# Patient Record
Sex: Male | Born: 1970 | Race: White | Hispanic: No | Marital: Married | Smoking: Current every day smoker
Health system: Southern US, Community
[De-identification: ages and names within clinical notes are randomized; demographics above are authoritative.]

## PROBLEM LIST (undated history)

## (undated) DIAGNOSIS — S149XXA Injury of unspecified nerves of neck, initial encounter: Secondary | ICD-10-CM

## (undated) DIAGNOSIS — F419 Anxiety disorder, unspecified: Secondary | ICD-10-CM

## (undated) DIAGNOSIS — F429 Obsessive-compulsive disorder, unspecified: Secondary | ICD-10-CM

## (undated) DIAGNOSIS — G589 Mononeuropathy, unspecified: Secondary | ICD-10-CM

## (undated) DIAGNOSIS — E119 Type 2 diabetes mellitus without complications: Secondary | ICD-10-CM

## (undated) DIAGNOSIS — F410 Panic disorder [episodic paroxysmal anxiety] without agoraphobia: Secondary | ICD-10-CM

## (undated) HISTORY — PX: HERNIA REPAIR: SHX51

## (undated) HISTORY — PX: CHOLECYSTECTOMY: SHX55

---

## 2013-09-22 ENCOUNTER — Encounter: Payer: Self-pay | Admitting: Emergency Medicine

## 2013-09-22 ENCOUNTER — Emergency Department
Admission: EM | Admit: 2013-09-22 | Discharge: 2013-09-22 | Disposition: A | Discharge status: Home / Self Care | Payer: BC Managed Care – PPO | Source: Home / Self Care | Attending: Family Medicine | Admitting: Family Medicine

## 2013-09-22 DIAGNOSIS — J069 Acute upper respiratory infection, unspecified: Secondary | ICD-10-CM

## 2013-09-22 DIAGNOSIS — R0781 Pleurodynia: Secondary | ICD-10-CM

## 2013-09-22 DIAGNOSIS — R062 Wheezing: Secondary | ICD-10-CM

## 2013-09-22 DIAGNOSIS — J40 Bronchitis, not specified as acute or chronic: Secondary | ICD-10-CM

## 2013-09-22 HISTORY — DX: Obsessive-compulsive disorder, unspecified: F42.9

## 2013-09-22 HISTORY — DX: Injury of unspecified nerves of neck, initial encounter: S14.9XXA

## 2013-09-22 HISTORY — DX: Mononeuropathy, unspecified: G58.9

## 2013-09-22 MED ORDER — AZITHROMYCIN 250 MG PO TABS: ORAL_TABLET | ORAL | Status: DC

## 2013-09-22 MED ORDER — METHYLPREDNISOLONE SODIUM SUCC 125 MG IJ SOLR
125.0000 mg | Freq: Once | INTRAMUSCULAR | Status: AC
  Administered 2013-09-22: 125 mg via INTRAMUSCULAR

## 2013-09-22 MED ORDER — GUAIFENESIN-CODEINE 100-10 MG/5ML PO SOLN: 5.0000 mL | Freq: Three times a day (TID) | ORAL | Status: DC | PRN

## 2013-09-22 MED ORDER — MELOXICAM 15 MG PO TABS: 15.0000 mg | ORAL_TABLET | Freq: Every day | ORAL | Status: DC

## 2013-09-22 NOTE — ED Provider Notes (Signed)
CSN: [id number]     Arrival date & time 09/22/13  1115 History   First MD Initiated Contact with Patient 09/22/13 1128     Chief Complaint  Patient presents with  . Nasal Congestion  . Back Pain    HPI  URI Symptoms  Onset: 2-3 weeks  Description: cough, pleuritic cp, rhinorrhea, nasal congestion Modifying factors:  Has pain with deep breathing. Worse in am. Worse with cough. Has been using vicodin with minimal improvement in sxs   Symptoms Nasal discharge: yes Fever: no Sore throat: no Cough: yes Wheezing: yes Ear pain: no GI symptoms: no Sick contacts: no  Red Flags  Stiff neck: no Dyspnea: no Rash: no Swallowing difficulty: no  Sinusitis Risk Factors Headache/face pain: no Double sickening: n tooth pain: ono  Allergy Risk Factors Sneezing: no Itchy scratchy throat: no Seasonal symptoms: no  Flu Risk Factors Headache: no muscle aches: no severe fatigue: no   Past Medical History  Diagnosis Date  . OCD (obsessive compulsive disorder)   . Pinched nerve in neck    Past Surgical History  Procedure Laterality Date  . Cholecystectomy    . Hernia repair     Family History  Problem Relation Age of Onset  . Cancer Mother    History  Substance Use Topics  . Smoking status: Former Games developer  . Smokeless tobacco: Never Used  . Alcohol Use: No    Review of Systems  All other systems reviewed and are negative.    Allergies  Penicillins  Home Medications   Current Outpatient Rx  Name  Route  Sig  Dispense  Refill  . clonazePAM (KLONOPIN) 1 MG tablet   Oral   Take 1 mg by mouth 2 (two) times daily as needed for anxiety.         Marland Kitchen HYDROcodone-acetaminophen (NORCO) 10-325 MG per tablet   Oral   Take 1 tablet by mouth every 6 (six) hours as needed for pain.         Marland Kitchen omeprazole (PRILOSEC) 20 MG capsule   Oral   Take 20 mg by mouth daily.         Marland Kitchen azithromycin (ZITHROMAX) 250 MG tablet      Take 2 tabs PO x 1 dose, then 1 tab PO QD x 4  days   6 tablet   0   . meloxicam (MOBIC) 15 MG tablet   Oral   Take 1 tablet (15 mg total) by mouth daily.   30 tablet   1    BP 122/76  Pulse 84  Temp(Src) 98.3 F (36.8 C) (Oral)  Resp 16  Ht 6\' 3"  (1.905 m)  Wt 221 lb 4 oz (100.358 kg)  BMI 27.65 kg/m2  SpO2 100% Physical Exam  Constitutional: He appears well-developed and well-nourished.  HENT:  Head: Normocephalic and atraumatic.  Right Ear: External ear normal.  Left Ear: External ear normal.  +nasal erythema, rhinorrhea bilaterally, + post oropharyngeal erythema    Eyes: Conjunctivae are normal. Pupils are equal, round, and reactive to light.  Neck: Normal range of motion. Neck supple.  Cardiovascular: Normal rate, regular rhythm and normal heart sounds.   Pulmonary/Chest: Effort normal and breath sounds normal. He has no wheezes.  + anterior and posterior chest wall TTP  + pain with deep breathing  Abdominal: Soft.  Musculoskeletal: Normal range of motion.  Lymphadenopathy:    He has no cervical adenopathy.  Neurological: He is alert.  Skin: Skin is warm.  ED Course  Procedures (including critical care time) Labs Review Labs Reviewed - No data to display Imaging Review No results found.    MDM   1. URI (upper respiratory infection)   2. Bronchitis   3. Pleuritic chest pain   4. Wheezing    Likely viral sxs with overlapping post viral cough and pleuritic.  Solumedrol 125mg  IM x 1 for wheezing. Zpak for atypical coverage.  Mobic and robitussin ac.  Discussed infectious and resp red flags  Follow up as needed.     The patient and/or caregiver has been counseled thoroughly with regard to treatment plan and/or medications prescribed including dosage, schedule, interactions, rationale for use, and possible side effects and they verbalize understanding. Diagnoses and expected course of recovery discussed and will return if not improved as expected or if the condition worsens. Patient and/or  caregiver verbalized understanding.        Doree Albee, MD 09/22/13 1153

## 2013-09-22 NOTE — ED Notes (Signed)
Patient c/o head cold, congestion, and mid upper back pain x 3 wks. Patient has tried OTC Sudafed with relief.

## 2015-07-27 DIAGNOSIS — R945 Abnormal results of liver function studies: Secondary | ICD-10-CM | POA: Insufficient documentation

## 2015-07-27 DIAGNOSIS — R7989 Other specified abnormal findings of blood chemistry: Secondary | ICD-10-CM | POA: Insufficient documentation

## 2015-07-27 DIAGNOSIS — E119 Type 2 diabetes mellitus without complications: Secondary | ICD-10-CM | POA: Insufficient documentation

## 2015-07-28 DIAGNOSIS — Z720 Tobacco use: Secondary | ICD-10-CM | POA: Insufficient documentation

## 2016-04-01 DIAGNOSIS — G894 Chronic pain syndrome: Secondary | ICD-10-CM | POA: Insufficient documentation

## 2016-04-01 DIAGNOSIS — M542 Cervicalgia: Secondary | ICD-10-CM | POA: Insufficient documentation

## 2018-04-02 DIAGNOSIS — Z79899 Other long term (current) drug therapy: Secondary | ICD-10-CM | POA: Insufficient documentation

## 2018-04-02 DIAGNOSIS — R05 Cough: Secondary | ICD-10-CM | POA: Insufficient documentation

## 2018-04-02 DIAGNOSIS — F17200 Nicotine dependence, unspecified, uncomplicated: Secondary | ICD-10-CM | POA: Insufficient documentation

## 2018-04-02 DIAGNOSIS — Z7289 Other problems related to lifestyle: Secondary | ICD-10-CM | POA: Insufficient documentation

## 2018-04-02 DIAGNOSIS — F410 Panic disorder [episodic paroxysmal anxiety] without agoraphobia: Secondary | ICD-10-CM | POA: Insufficient documentation

## 2018-04-02 DIAGNOSIS — R0981 Nasal congestion: Secondary | ICD-10-CM | POA: Insufficient documentation

## 2018-04-02 DIAGNOSIS — F1210 Cannabis abuse, uncomplicated: Secondary | ICD-10-CM | POA: Insufficient documentation

## 2018-04-03 ENCOUNTER — Other Ambulatory Visit: Payer: Self-pay

## 2018-04-03 ENCOUNTER — Emergency Department (HOSPITAL_BASED_OUTPATIENT_CLINIC_OR_DEPARTMENT_OTHER)
Admission: EM | Admit: 2018-04-03 | Discharge: 2018-04-03 | Disposition: A | Discharge status: Home / Self Care | Payer: Self-pay | Attending: Emergency Medicine | Admitting: Emergency Medicine

## 2018-04-03 ENCOUNTER — Encounter (HOSPITAL_BASED_OUTPATIENT_CLINIC_OR_DEPARTMENT_OTHER): Payer: Self-pay

## 2018-04-03 DIAGNOSIS — Z765 Malingerer [conscious simulation]: Secondary | ICD-10-CM

## 2018-04-03 DIAGNOSIS — F410 Panic disorder [episodic paroxysmal anxiety] without agoraphobia: Secondary | ICD-10-CM

## 2018-04-03 HISTORY — DX: Panic disorder (episodic paroxysmal anxiety): F41.0

## 2018-04-03 HISTORY — DX: Anxiety disorder, unspecified: F41.9

## 2018-04-03 MED ORDER — HYDROXYZINE HCL 10 MG/5ML PO SYRP
ORAL_SOLUTION | ORAL | Status: AC
  Filled 2018-04-03: qty 1

## 2018-04-03 MED ORDER — HYDROXYZINE HCL 10 MG/5ML PO SYRP: 10.0000 mg | ORAL_SOLUTION | Freq: Once | ORAL | Status: DC

## 2018-04-03 MED ORDER — HYDROXYZINE HCL 10 MG PO TABS
10.0000 mg | ORAL_TABLET | Freq: Once | ORAL | Status: DC
  Filled 2018-04-03: qty 1

## 2018-04-03 NOTE — ED Provider Notes (Addendum)
[facility] EMERGENCY DEPARTMENT Provider Note   CSN: [id number] Arrival date & time: 04/02/18  2359     History   Chief Complaint Chief Complaint  Patient presents with  . Panic Attack    HPI Wayne Jackson. is a 47 y.o. male.  The history is provided by the patient. No language interpreter was used.  Medication Refill  Medications/supplies requested:  Klonipin Reason for request:  Medications ran out Medications taken before: yes - see home medications   Patient has complete original prescription information: no   Source of information: DEA database  Anxiety  This is a chronic problem. The current episode started more than 1 week ago. The problem occurs constantly. The problem has not changed since onset.Pertinent negatives include no chest pain, no abdominal pain, no headaches and no shortness of breath. Nothing aggravates the symptoms. Nothing relieves the symptoms. The treatment provided no relief.  Patient with a h/o anxiety who takes Klonopin 3 times daily presents stating he is visiting from [place] and out of his Klonopin.  States also he as a cough that is dry and nasal congestion.  Stated he was seen yesterday at an outside facility and prescribed cough syrup.  States he was having a panic attack but this has resolved prior to arrival.  Last took cough syrup at 830 pm. He would like a Klonopin RX as he did not receive one at urgent care.    Past Medical History:  Diagnosis Date  . Anxiety   . OCD (obsessive compulsive disorder)   . Panic attack   . Pinched nerve in neck     There are no active problems to display for this patient.   Past Surgical History:  Procedure Laterality Date  . CHOLECYSTECTOMY    . HERNIA REPAIR          Home Medications    Prior to Admission medications   Medication Sig Start Date End Date Taking? Authorizing Provider  azithromycin (ZITHROMAX) 250 MG tablet Take 2 tabs PO x 1 dose, then 1 tab PO QD x 4 days 09/22/13    Floydene Flock, MD  clonazePAM (KLONOPIN) 1 MG tablet Take 1 mg by mouth 2 (two) times daily as needed for anxiety.    [provider]  guaiFENesin-codeine 100-10 MG/5ML syrup Take 5 mLs by mouth 3 (three) times daily as needed for cough. 09/22/13   Floydene Flock, MD  HYDROcodone-acetaminophen (NORCO) 10-325 MG per tablet Take 1 tablet by mouth every 6 (six) hours as needed for pain.    [provider]  meloxicam (MOBIC) 15 MG tablet Take 1 tablet (15 mg total) by mouth daily. 09/22/13   Floydene Flock, MD  omeprazole (PRILOSEC) 20 MG capsule Take 20 mg by mouth daily.    [provider]    Family History Family History  Problem Relation Age of Onset  . Cancer Mother     Social History Social History   Tobacco Use  . Smoking status: Current Every Day Smoker  . Smokeless tobacco: Never Used  Substance Use Topics  . Alcohol use: Yes    Comment: occ  . Drug use: Yes    Types: Marijuana     Allergies   Penicillins   Review of Systems Review of Systems  Constitutional: Negative for diaphoresis.  Respiratory: Negative for shortness of breath.   Cardiovascular: Negative for chest pain, palpitations and leg swelling.  Gastrointestinal: Negative for abdominal pain, nausea and vomiting.  Neurological:  Negative for dizziness, tremors, syncope, facial asymmetry, speech difficulty, weakness, light-headedness, numbness and headaches.  Psychiatric/Behavioral: Negative for hallucinations, self-injury and suicidal ideas. The patient is not nervous/anxious.   All other systems reviewed and are negative.    Physical Exam Updated Vital Signs BP (!) 131/98 (BP Location: Left Arm)   Pulse 77   Temp 98.3 F (36.8 C) (Oral)   Resp 20   Ht  (1.905 m)   Wt 86.5 kg (190 lb 11.2 oz)   SpO2 100%   BMI 23.84 kg/m   Physical Exam  Constitutional: He is oriented to person, place, and time. He appears well-developed and well-nourished. No distress.    Resting comfortably in chair smiling using his phone  HENT:  Head: Normocephalic and atraumatic.  Right Ear: External ear normal.  Left Ear: External ear normal.  Mouth/Throat: Oropharynx is clear and moist. No oropharyngeal exudate.  Eyes: Pupils are equal, round, and reactive to light. Conjunctivae are normal.  Neck: Normal range of motion. Neck supple.  Cardiovascular: Normal rate, regular rhythm, normal heart sounds and intact distal pulses.  Pulmonary/Chest: Effort normal and breath sounds normal. No stridor. He has no wheezes. He has no rales.  Abdominal: Soft. Bowel sounds are normal. He exhibits no mass. There is no tenderness. There is no rebound and no guarding.  Musculoskeletal: Normal range of motion. He exhibits no edema or tenderness.  Neurological: He is alert and oriented to person, place, and time.  Skin: Skin is warm and dry. Capillary refill takes less than 2 seconds.  Psychiatric: His mood appears not anxious. His affect is not labile. He expresses no homicidal and no suicidal ideation.  Nursing note and vitals reviewed.    ED Treatments / Results  Labs (all labs ordered are listed, but only abnormal results are displayed) Labs Reviewed - No data to display  EKG None  Radiology No results found.  Procedures Procedures (including critical care time)  Medications Ordered in ED Medications  hydrOXYzine (ATARAX) 10 MG/5ML syrup 10 mg (10 mg Oral Refused 04/03/18 0104)    DEA database reviewed and patient is getting RX for Klonopin every month from a doctor in [place].   Patient was informed we do not refill narcotic or benzodiazepine scripts in the ED.  EDP referred patient to the signs posted at registration. Patient drove self to the ED.  EDP stated we could not give this medication in the ED and allow patient to drive or uber to the place he is staying.    Patient originally stated he did not receive any RX from urgent care.  He then stated he received  cough syrup at urgent care.  He then revised his statement and stated he filled the cough syrup which he was told would help with his anxiety for not the other medications prescribed to him. He stated he feels much better since he arrived in the ED.  EDP stated she would review what he had been given from urgent care and give him a dose of non narcotic and no benzodiazepine medication in the ED.  Patient was happy with this.  EDP reviewed outside records.  Patient was given RX for promethazine cough syrup as well as guaifenesin and atarax for his anxiety (quantity 30).  EDP wrote for a dose of atarax.  Nurse informed EDP patient refused as this was not the medication he wanted.  Patient was instructed to follow up with his PMD for refill of his klonopin.  The patient was appropriately medically screened and life threatening and emergent conditions considered.  Patient has no signs of withdrawal and has already stated he is not currently having an attack.  His exam and vitals signs are benign and reassuring.   Patient reported to me would not take his papers because he "did not get what he wanted." patient is drug seeking and he is not getting a refill of benzodiazepines in the ED  Final Clinical Impressions(s) / ED Diagnoses   Final diagnoses:  Panic attack   Follow up with your family doctor for refills on your klonopin.  Return for weakness, numbness, changes in vision or speech, fevers >100.4 unrelieved by medication, shortness of breath, intractable vomiting, or diarrhea, abdominal pain, Inability to tolerate liquids or food, cough, altered mental status or any concerns. No signs of systemic illness or infection. The patient is nontoxic-appearing on exam and vital signs are within normal limits.   I have reviewed the triage vital signs and the nursing notes. Pertinent labs &imaging results that were available during my care of the patient were reviewed by me and considered in my medical  decision making (see chart for details).  After history, exam, and medical workup I feel the patient has been appropriately medically screened and is safe for discharge home. Pertinent diagnoses were discussed with the patient. Patient was given return precautions.      Arlisa Leclere, MD 04/03/18 1610    Cy Blamer, MD 04/03/18 2304

## 2018-04-03 NOTE — ED Notes (Signed)
Pt refused vistaril, wants a dose of klonopin.  Informed pt that he is not getting a dose of klonopin and that the vistaril works the same, pt refused.

## 2018-04-03 NOTE — ED Triage Notes (Signed)
C/o panic attacks-states he is out of klonopin-pt states he lives in [place] and is working in town-NAD-steady gait

## 2018-04-03 NOTE — ED Notes (Signed)
Pt refused the medication that was offered to him.  Pt was texting on phone throughout discharge, at this time he does not need anything further.

## 2018-07-17 ENCOUNTER — Other Ambulatory Visit (HOSPITAL_COMMUNITY): Payer: Self-pay | Admitting: Family Medicine

## 2018-07-17 DIAGNOSIS — R634 Abnormal weight loss: Secondary | ICD-10-CM

## 2018-07-25 ENCOUNTER — Emergency Department (HOSPITAL_BASED_OUTPATIENT_CLINIC_OR_DEPARTMENT_OTHER): Payer: BLUE CROSS/BLUE SHIELD

## 2018-07-25 ENCOUNTER — Emergency Department (HOSPITAL_BASED_OUTPATIENT_CLINIC_OR_DEPARTMENT_OTHER)
Admission: EM | Admit: 2018-07-25 | Discharge: 2018-07-25 | Disposition: A | Discharge status: Home / Self Care | Payer: BLUE CROSS/BLUE SHIELD | Attending: Emergency Medicine | Admitting: Emergency Medicine

## 2018-07-25 ENCOUNTER — Encounter (HOSPITAL_BASED_OUTPATIENT_CLINIC_OR_DEPARTMENT_OTHER): Payer: Self-pay

## 2018-07-25 ENCOUNTER — Other Ambulatory Visit: Payer: Self-pay

## 2018-07-25 DIAGNOSIS — Z6379 Other stressful life events affecting family and household: Secondary | ICD-10-CM | POA: Diagnosis not present

## 2018-07-25 DIAGNOSIS — R197 Diarrhea, unspecified: Secondary | ICD-10-CM | POA: Insufficient documentation

## 2018-07-25 DIAGNOSIS — R630 Anorexia: Secondary | ICD-10-CM | POA: Diagnosis not present

## 2018-07-25 DIAGNOSIS — Z794 Long term (current) use of insulin: Secondary | ICD-10-CM | POA: Diagnosis not present

## 2018-07-25 DIAGNOSIS — Z79899 Other long term (current) drug therapy: Secondary | ICD-10-CM | POA: Diagnosis not present

## 2018-07-25 DIAGNOSIS — R0602 Shortness of breath: Secondary | ICD-10-CM | POA: Insufficient documentation

## 2018-07-25 DIAGNOSIS — R634 Abnormal weight loss: Secondary | ICD-10-CM | POA: Diagnosis not present

## 2018-07-25 DIAGNOSIS — R739 Hyperglycemia, unspecified: Secondary | ICD-10-CM

## 2018-07-25 DIAGNOSIS — F17210 Nicotine dependence, cigarettes, uncomplicated: Secondary | ICD-10-CM | POA: Diagnosis not present

## 2018-07-25 DIAGNOSIS — R51 Headache: Secondary | ICD-10-CM | POA: Insufficient documentation

## 2018-07-25 DIAGNOSIS — R112 Nausea with vomiting, unspecified: Secondary | ICD-10-CM | POA: Diagnosis present

## 2018-07-25 DIAGNOSIS — E1165 Type 2 diabetes mellitus with hyperglycemia: Secondary | ICD-10-CM | POA: Diagnosis not present

## 2018-07-25 HISTORY — DX: Type 2 diabetes mellitus without complications: E11.9

## 2018-07-25 LAB — URINALYSIS, ROUTINE W REFLEX MICROSCOPIC
Bilirubin Urine: NEGATIVE
Glucose, UA: >=500 mg/dL — AB
Hgb urine dipstick: NEGATIVE
Ketones, ur: 40 mg/dL — AB
Leukocytes, UA: NEGATIVE
Nitrite: NEGATIVE
Protein, ur: NEGATIVE mg/dL
Specific Gravity, Urine: <1.005 — ABNORMAL LOW (ref 1.005–1.030)
pH: 5.5 (ref 5.0–8.0)

## 2018-07-25 LAB — CBC WITH DIFFERENTIAL/PLATELET
Basophils Absolute: 0 10*3/uL (ref 0.0–0.1)
Basophils Relative: 0 %
Eosinophils Absolute: 0.1 10*3/uL (ref 0.0–0.7)
Eosinophils Relative: 1 %
HCT: 40.6 % (ref 39.0–52.0)
Hemoglobin: 15.1 g/dL (ref 13.0–17.0)
Lymphocytes Relative: 20 %
Lymphs Abs: 2.7 10*3/uL (ref 0.7–4.0)
MCH: 31.9 pg (ref 26.0–34.0)
MCHC: 37.2 g/dL — ABNORMAL HIGH (ref 30.0–36.0)
MCV: 85.7 fL (ref 78.0–100.0)
Monocytes Absolute: 1.1 10*3/uL — ABNORMAL HIGH (ref 0.1–1.0)
Monocytes Relative: 8 %
Neutro Abs: 9.8 10*3/uL — ABNORMAL HIGH (ref 1.7–7.7)
Neutrophils Relative %: 71 %
Platelets: 206 10*3/uL (ref 150–400)
RBC: 4.74 MIL/uL (ref 4.22–5.81)
RDW: 12.5 % (ref 11.5–15.5)
WBC: 13.7 10*3/uL — ABNORMAL HIGH (ref 4.0–10.5)

## 2018-07-25 LAB — COMPREHENSIVE METABOLIC PANEL
ALT: 99 U/L — ABNORMAL HIGH (ref 0–44)
AST: 43 U/L — ABNORMAL HIGH (ref 15–41)
Albumin: 4.3 g/dL (ref 3.5–5.0)
Alkaline Phosphatase: 55 U/L (ref 38–126)
Anion gap: 15 (ref 5–15)
BUN: 13 mg/dL (ref 6–20)
CO2: 24 mmol/L (ref 22–32)
Calcium: 8.5 mg/dL — ABNORMAL LOW (ref 8.9–10.3)
Chloride: 85 mmol/L — ABNORMAL LOW (ref 98–111)
Creatinine, Ser: 0.71 mg/dL (ref 0.61–1.24)
GFR calc Af Amer: >60 mL/min (ref >60)
GFR calc non Af Amer: >60 mL/min (ref >60)
Glucose, Bld: 561 mg/dL (ref 70–99)
Potassium: 4.3 mmol/L (ref 3.5–5.1)
Sodium: 124 mmol/L — ABNORMAL LOW (ref 135–145)
Total Bilirubin: 1.7 mg/dL — ABNORMAL HIGH (ref 0.3–1.2)
Total Protein: 8 g/dL (ref 6.5–8.1)

## 2018-07-25 LAB — TROPONIN I: Troponin I: <0.03 ng/mL (ref <0.03)

## 2018-07-25 LAB — URINALYSIS, MICROSCOPIC (REFLEX)

## 2018-07-25 LAB — LIPASE, BLOOD: Lipase: 38 U/L (ref 11–51)

## 2018-07-25 LAB — CBG MONITORING, ED
Glucose-Capillary: >600 mg/dL (ref 70–99)
Glucose-Capillary: 347 mg/dL — ABNORMAL HIGH (ref 70–99)

## 2018-07-25 MED ORDER — METOCLOPRAMIDE HCL 10 MG PO TABS: 10.0000 mg | ORAL_TABLET | Freq: Four times a day (QID) | ORAL | 0 refills | Status: DC

## 2018-07-25 MED ORDER — METOCLOPRAMIDE HCL 5 MG/ML IJ SOLN
10.0000 mg | Freq: Once | INTRAMUSCULAR | Status: AC
  Administered 2018-07-25: 10 mg via INTRAVENOUS
  Filled 2018-07-25: qty 2

## 2018-07-25 MED ORDER — OXYCODONE-ACETAMINOPHEN 5-325 MG PO TABS: 1.0000 | ORAL_TABLET | Freq: Four times a day (QID) | ORAL | 0 refills | Status: DC | PRN

## 2018-07-25 MED ORDER — KETOROLAC TROMETHAMINE 30 MG/ML IJ SOLN
30.0000 mg | Freq: Once | INTRAMUSCULAR | Status: AC
  Administered 2018-07-25: 30 mg via INTRAVENOUS
  Filled 2018-07-25: qty 1

## 2018-07-25 MED ORDER — INSULIN GLARGINE 100 UNIT/ML ~~LOC~~ SOLN
10.0000 [IU] | Freq: Once | SUBCUTANEOUS | Status: AC
  Administered 2018-07-25: 10 [IU] via SUBCUTANEOUS
  Filled 2018-07-25: qty 10

## 2018-07-25 MED ORDER — INSULIN LISPRO 100 UNIT/ML CARTRIDGE: SUBCUTANEOUS | 0 refills | Status: AC

## 2018-07-25 MED ORDER — SODIUM CHLORIDE 0.9 % IV BOLUS
1000.0000 mL | Freq: Once | INTRAVENOUS | Status: AC
  Administered 2018-07-25: 1000 mL via INTRAVENOUS

## 2018-07-25 MED ORDER — INSULIN REGULAR HUMAN 100 UNIT/ML IJ SOLN: 9.0000 [IU] | Freq: Once | INTRAMUSCULAR | Status: DC

## 2018-07-25 MED ORDER — BLOOD GLUCOSE MONITOR KIT: PACK | 0 refills | Status: AC

## 2018-07-25 MED ORDER — INSULIN GLARGINE 300 UNIT/ML ~~LOC~~ SOPN: 10.0000 [IU] | PEN_INJECTOR | Freq: Every day | SUBCUTANEOUS | 0 refills | Status: DC

## 2018-07-25 NOTE — ED Notes (Signed)
Date and time results received: 07/25/18 1845   Test: glucose Critical Value: 561 Name of Provider Notified: Trinna PostAlex PA Orders Received? Or Actions Taken?: no orders given
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
## 2018-07-25 NOTE — ED Notes (Signed)
Pt teaching provided on medications that may cause drowsiness. Pt instructed not to drive or operate heavy machinery while taking the prescribed medication. Pt verbalized understanding.   

## 2018-07-25 NOTE — Discharge Instructions (Addendum)
Take Toujeo as prescribed daily.  Use Humalog sliding scale insulin.  Please call your doctor tomorrow to make them aware of your visit today.  Take Reglan every 6 hours as needed for nausea or vomiting.  Take 1-2 Percocet every 6 hours as needed for severe pain.  Continue ibuprofen as you are taking at home.  Please return to the emergency department if you develop any new or worsening symptoms.  Do not drink alcohol, drive, operate machinery or participate in any other potentially dangerous activities while taking opiate pain medication as it may make you sleepy. Do not take this medication with any other sedating medications, either prescription or over-the-counter. If you were prescribed Percocet or Vicodin, do not take these with acetaminophen (Tylenol) as it is already contained within these medications and overdose of Tylenol is dangerous.   This medication is an opiate (or narcotic) pain medication and can be habit forming.  Use it as little as possible to achieve adequate pain control.  Do not use or use it with extreme caution if you have a history of opiate abuse or dependence. This medication is intended for your use only - do not give any to anyone else and keep it in a secure place where nobody else, especially children, have access to it. It will also cause or worsen constipation, so you may want to consider taking an over-the-counter stool softener while you are taking this medication.

## 2018-07-29 ENCOUNTER — Telehealth (HOSPITAL_BASED_OUTPATIENT_CLINIC_OR_DEPARTMENT_OTHER): Payer: Self-pay | Admitting: Emergency Medicine

## 2018-10-22 DIAGNOSIS — F419 Anxiety disorder, unspecified: Secondary | ICD-10-CM | POA: Insufficient documentation

## 2018-10-22 DIAGNOSIS — F321 Major depressive disorder, single episode, moderate: Secondary | ICD-10-CM | POA: Insufficient documentation

## 2018-10-22 DIAGNOSIS — G479 Sleep disorder, unspecified: Secondary | ICD-10-CM | POA: Insufficient documentation

## 2020-03-03 LAB — HM DIABETES EYE EXAM
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
## 2020-09-29 DIAGNOSIS — G479 Sleep disorder, unspecified: Secondary | ICD-10-CM | POA: Diagnosis not present

## 2020-09-29 DIAGNOSIS — F419 Anxiety disorder, unspecified: Secondary | ICD-10-CM | POA: Diagnosis not present

## 2020-09-29 DIAGNOSIS — F321 Major depressive disorder, single episode, moderate: Secondary | ICD-10-CM | POA: Diagnosis not present

## 2020-09-29 DIAGNOSIS — Z79899 Other long term (current) drug therapy: Secondary | ICD-10-CM | POA: Diagnosis not present

## 2020-10-01 DIAGNOSIS — E782 Mixed hyperlipidemia: Secondary | ICD-10-CM | POA: Diagnosis not present

## 2020-10-01 DIAGNOSIS — E1165 Type 2 diabetes mellitus with hyperglycemia: Secondary | ICD-10-CM | POA: Diagnosis not present

## 2020-10-01 DIAGNOSIS — E1142 Type 2 diabetes mellitus with diabetic polyneuropathy: Secondary | ICD-10-CM | POA: Diagnosis not present

## 2020-10-01 DIAGNOSIS — Z720 Tobacco use: Secondary | ICD-10-CM | POA: Diagnosis not present

## 2020-10-06 ENCOUNTER — Encounter: Payer: Self-pay | Admitting: Physician Assistant

## 2020-10-06 ENCOUNTER — Ambulatory Visit: Payer: BC Managed Care – PPO | Admitting: Physician Assistant

## 2020-10-06 ENCOUNTER — Other Ambulatory Visit: Payer: Self-pay

## 2020-10-06 VITALS — BP 117/88 | HR 88 | Temp 98.0°F | Ht 74.0 in | Wt 207.8 lb

## 2020-10-06 DIAGNOSIS — Z1159 Encounter for screening for other viral diseases: Secondary | ICD-10-CM

## 2020-10-06 DIAGNOSIS — K732 Chronic active hepatitis, not elsewhere classified: Secondary | ICD-10-CM

## 2020-10-06 DIAGNOSIS — E1159 Type 2 diabetes mellitus with other circulatory complications: Secondary | ICD-10-CM

## 2020-10-06 DIAGNOSIS — E119 Type 2 diabetes mellitus without complications: Secondary | ICD-10-CM

## 2020-10-06 DIAGNOSIS — E1169 Type 2 diabetes mellitus with other specified complication: Secondary | ICD-10-CM

## 2020-10-06 DIAGNOSIS — B1920 Unspecified viral hepatitis C without hepatic coma: Secondary | ICD-10-CM

## 2020-10-06 DIAGNOSIS — F909 Attention-deficit hyperactivity disorder, unspecified type: Secondary | ICD-10-CM | POA: Diagnosis not present

## 2020-10-06 DIAGNOSIS — Z114 Encounter for screening for human immunodeficiency virus [HIV]: Secondary | ICD-10-CM

## 2020-10-06 DIAGNOSIS — Z794 Long term (current) use of insulin: Secondary | ICD-10-CM | POA: Diagnosis not present

## 2020-10-06 DIAGNOSIS — E785 Hyperlipidemia, unspecified: Secondary | ICD-10-CM

## 2020-10-06 DIAGNOSIS — G4700 Insomnia, unspecified: Secondary | ICD-10-CM

## 2020-10-06 DIAGNOSIS — Z23 Encounter for immunization: Secondary | ICD-10-CM

## 2020-10-06 DIAGNOSIS — I152 Hypertension secondary to endocrine disorders: Secondary | ICD-10-CM

## 2020-10-06 NOTE — Patient Instructions (Signed)
Diabetes Mellitus and Exercise Exercising regularly is important for your overall health, especially when you have diabetes (diabetes mellitus). Exercising is not only about losing weight. It has many other health benefits, such as increasing muscle strength and bone density and reducing body fat and stress. This leads to improved fitness, flexibility, and endurance, all of which result in better overall health. Exercise has additional benefits for people with diabetes, including:  Reducing appetite.  Helping to lower and control blood glucose.  Lowering blood pressure.  Helping to control amounts of fatty substances (lipids) in the blood, such as cholesterol and triglycerides.  Helping the body to respond better to insulin (improving insulin sensitivity).  Reducing how much insulin the body needs.  Decreasing the risk for heart disease by: ? Lowering cholesterol and triglyceride levels. ? Increasing the levels of good cholesterol. ? Lowering blood glucose levels. What is my activity plan? Your health care provider or certified diabetes educator can help you make a plan for the type and frequency of exercise (activity plan) that works for you. Make sure that you:  Do at least 150 minutes of moderate-intensity or vigorous-intensity exercise each week. This could be brisk walking, biking, or water aerobics. ? Do stretching and strength exercises, such as yoga or weightlifting, at least 2 times a week. ? Spread out your activity over at least 3 days of the week.  Get some form of physical activity every day. ? Do not go more than 2 days in a row without some kind of physical activity. ? Avoid being inactive for more than 30 minutes at a time. Take frequent breaks to walk or stretch.  Choose a type of exercise or activity that you enjoy, and set realistic goals.  Start slowly, and gradually increase the intensity of your exercise over time. What do I need to know about managing my  diabetes?   Check your blood glucose before and after exercising. ? If your blood glucose is 240 mg/dL (13.3 mmol/L) or higher before you exercise, check your urine for ketones. If you have ketones in your urine, do not exercise until your blood glucose returns to normal. ? If your blood glucose is 100 mg/dL (5.6 mmol/L) or lower, eat a snack containing 15-20 grams of carbohydrate. Check your blood glucose 15 minutes after the snack to make sure that your level is above 100 mg/dL (5.6 mmol/L) before you start your exercise.  Know the symptoms of low blood glucose (hypoglycemia) and how to treat it. Your risk for hypoglycemia increases during and after exercise. Common symptoms of hypoglycemia can include: ? Hunger. ? Anxiety. ? Sweating and feeling clammy. ? Confusion. ? Dizziness or feeling light-headed. ? Increased heart rate or palpitations. ? Blurry vision. ? Tingling or numbness around the mouth, lips, or tongue. ? Tremors or shakes. ? Irritability.  Keep a rapid-acting carbohydrate snack available before, during, and after exercise to help prevent or treat hypoglycemia.  Avoid injecting insulin into areas of the body that are going to be exercised. For example, avoid injecting insulin into: ? The arms, when playing tennis. ? The legs, when jogging.  Keep records of your exercise habits. Doing this can help you and your health care provider adjust your diabetes management plan as needed. Write down: ? Food that you eat before and after you exercise. ? Blood glucose levels before and after you exercise. ? The type and amount of exercise you have done. ? When your insulin is expected to peak, if you use   insulin. Avoid exercising at times when your insulin is peaking.  When you start a new exercise or activity, work with your health care provider to make sure the activity is safe for you, and to adjust your insulin, medicines, or food intake as needed.  Drink plenty of water while  you exercise to prevent dehydration or heat stroke. Drink enough fluid to keep your urine clear or pale yellow. Summary  Exercising regularly is important for your overall health, especially when you have diabetes (diabetes mellitus).  Exercising has many health benefits, such as increasing muscle strength and bone density and reducing body fat and stress.  Your health care provider or certified diabetes educator can help you make a plan for the type and frequency of exercise (activity plan) that works for you.  When you start a new exercise or activity, work with your health care provider to make sure the activity is safe for you, and to adjust your insulin, medicines, or food intake as needed. This information is not intended to replace advice given to you by your health care provider. Make sure you discuss any questions you have with your health care provider. Document Revised: 06/15/2017 Document Reviewed: 05/02/2016 [employer] Patient Education  2020 [employer]  

## 2020-10-06 NOTE — Progress Notes (Signed)
New patient visit   Patient: Wayne Jackson.   DOB: 08-Apr-1971   48 y.o. Male  MRN: [id number] Visit Date: 10/06/2020  Today's healthcare provider: Trinna Post, PA-C   Chief Complaint  Patient presents with  . New Patient (Initial Visit)  I,Jaelynne Hockley M Kasha Howeth,acting as a scribe for Trinna Post, PA-C.,have documented all relevant documentation on the behalf of Trinna Post, PA-C,as directed by  Trinna Post, PA-C while in the presence of Trinna Post, PA-C.  Subjective    Wayne Jackson. is a 49 y.o. male who presents today as a new patient to establish care.  HPI   Lives with wife stacy and 58 year old daughter. Lives with two dogs. Was seen at [facility] by Dr. Vista Lawman. 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   Diabetes Mellitus Type II, Follow-up  Lab Results  Component Value Date   HGBA1C 6.4 (H) 10/06/2020   Wt Readings from Last 3 Encounters:  10/06/20 207 lb 12.8 oz (94.3 kg)  04/03/18 190 lb 11.2 oz (86.5 kg)  09/22/13 221 lb 4 oz (100.4 kg)   Last seen for diabetes 6 months ago. Has had diabetes 5-6 years.  Management since then includes metformin 500 mg twice a day, basaglar 50 units nightly. He does not recall his last A1c. Patient is not on a statin, reports his liver enzymes have been elevated in the past.  He reports good compliance with treatment. He is not having side effects.  Symptoms: No fatigue No foot ulcerations  No appetite changes No nausea  No paresthesia of the feet  No polydipsia  No polyuria No visual disturbances   No vomiting     Home blood sugar records: fasting range: 120s- 200s  Episodes of hypoglycemia? No    Current insulin regiment: basaglar 50 units  Most Recent Eye Exam: 04/2020 done at [facility] in [place], patient is not sure if this was a diabetic eye exam.  Current exercise: aerobics Current diet habits: not asked  Pertinent Labs: Lab Results  Component  Value Date   CHOL 142 10/06/2020   HDL 26 (L) 10/06/2020   LDLCALC 85 10/06/2020   TRIG 177 (H) 10/06/2020   CHOLHDL 5.5 (H) 10/06/2020   Lab Results  Component Value Date   NA 135 10/06/2020   K 4.1 10/06/2020   CREATININE 0.65 (L) 10/06/2020   GFRNONAA 117 10/06/2020   GFRAA 135 10/06/2020   GLUCOSE 162 (H) 10/06/2020     ---------------------------------------------------------------------------------------------------  Diabetes: fasting sugars in the mornings arranging around 120's-200's. Patient is currently taking Humalog, Metformin and Toujeo.  Hypertension: Patient states he was diagnosed with hypertension but medication was stopped due to blood pressure getting controlled.  Anxiety, Follow-up  He was last seen for anxiety 5 days ago. Changes made at last visit include stopped strattera. He is currently on cymbalta and lunesta.    He reports good compliance with treatment. He reports good tolerance of treatment. He is not having side effects.   He feels his anxiety is moderate and Unchanged since last visit.  Symptoms: No chest pain No difficulty concentrating  No dizziness No fatigue  No feelings of losing control No insomnia  No irritable No palpitations  No panic attacks No racing thoughts  No shortness of breath No sweating  No tremors/shakes    GAD-7 Results No flowsheet data found.  PHQ-9 Scores No flowsheet data found.  Past Medical History:  Diagnosis Date  . Anxiety   . Diabetes mellitus without complication ([place])   . OCD (obsessive compulsive disorder)   . Panic attack   . Pinched nerve in neck    Past Surgical History:  Procedure Laterality Date  . CHOLECYSTECTOMY    . HERNIA REPAIR     Family Status  Relation Name Status  . Mother  Alive  . Father  Alive  . Sister  Alive  . Daughter  Alive  . MGM  Deceased  . MGF  Deceased  . PGM  Deceased  . PGF  Deceased   Family History  Problem Relation Age of Onset  . Breast cancer  Mother   . Diabetes Father    Social History   Socioeconomic History  . Marital status: Married    Spouse name: Not on file  . Number of children: 1  . Years of education: Not on file  . Highest education level: Not on file  Occupational History  . Not on file  Tobacco Use  . Smoking status: Current Every Day Smoker    Packs/day: 0.50    Years: 4.00    Pack years: 2.00    Types: Cigarettes  . Smokeless tobacco: Never Used  Substance and Sexual Activity  . Alcohol use: Yes    Comment: occ  . Drug use: Yes    Types: Marijuana  . Sexual activity: Not on file  Other Topics Concern  . Not on file  Social History Narrative  . Not on file   Social Determinants of Health   Financial Resource Strain:   . Difficulty of Paying Living Expenses: Not on file  Food Insecurity:   . Worried About Charity fundraiser in the Last Year: Not on file  . Ran Out of Food in the Last Year: Not on file  Transportation Needs:   . Lack of Transportation (Medical): Not on file  . Lack of Transportation (Non-Medical): Not on file  Physical Activity:   . Days of Exercise per Week: Not on file  . Minutes of Exercise per Session: Not on file  Stress:   . Feeling of Stress : Not on file  Social Connections:   . Frequency of Communication with Friends and Family: Not on file  . Frequency of Social Gatherings with Friends and Family: Not on file  . Attends Religious Services: Not on file  . Active Member of Clubs or Organizations: Not on file  . Attends Archivist Meetings: Not on file  . Marital Status: Not on file   Outpatient Medications Prior to Visit  Medication Sig  . blood glucose meter kit and supplies KIT Dispense based on patient and insurance preference. Use up to four times daily as directed. (FOR ICD-9 250.00, 250.01).  . Insulin Glargine (TOUJEO SOLOSTAR) 300 UNIT/ML SOPN Inject 10 Units into the skin daily.  . insulin lispro (HUMALOG) 100 UNIT/ML cartridge Use with  sliding scale as prescribed previously.  . metFORMIN (GLUCOPHAGE) 500 MG tablet Take 500 mg by mouth 2 (two) times daily.  . Eszopiclone 3 MG TABS Take 3 mg by mouth at bedtime as needed.  . gabapentin (NEURONTIN) 600 MG tablet Take by mouth.  . [DISCONTINUED] azithromycin (ZITHROMAX) 250 MG tablet Take 2 tabs PO x 1 dose, then 1 tab PO QD x 4 days  . [DISCONTINUED] clonazePAM (KLONOPIN) 1 MG tablet Take 1 mg by mouth 2 (two) times daily as needed for anxiety. (Patient not taking: Reported on 10/06/2020)  . [  DISCONTINUED] guaiFENesin-codeine 100-10 MG/5ML syrup Take 5 mLs by mouth 3 (three) times daily as needed for cough.  . [DISCONTINUED] HYDROcodone-acetaminophen (NORCO) 10-325 MG per tablet Take 1 tablet by mouth every 6 (six) hours as needed for pain. (Patient not taking: Reported on 10/06/2020)  . [DISCONTINUED] meloxicam (MOBIC) 15 MG tablet Take 1 tablet (15 mg total) by mouth daily. (Patient not taking: Reported on 10/06/2020)  . [DISCONTINUED] metoCLOPramide (REGLAN) 10 MG tablet Take 1 tablet (10 mg total) by mouth every 6 (six) hours. (Patient not taking: Reported on 10/06/2020)  . [DISCONTINUED] omeprazole (PRILOSEC) 20 MG capsule Take 20 mg by mouth daily. (Patient not taking: Reported on 10/06/2020)  . [DISCONTINUED] oxyCODONE-acetaminophen (PERCOCET/ROXICET) 5-325 MG tablet Take 1-2 tablets by mouth every 6 (six) hours as needed for severe pain. (Patient not taking: Reported on 10/06/2020)   No facility-administered medications prior to visit.   Allergies  Allergen Reactions  . Penicillins Rash  . Clindamycin Rash     There is no immunization history on file for this patient.  Health Maintenance  Topic Date Due  . PNEUMOCOCCAL POLYSACCHARIDE VACCINE AGE 96-64 HIGH RISK  Never done  . OPHTHALMOLOGY EXAM  Never done  . COVID-19 Vaccine (1) Never done  . TETANUS/TDAP  Never done  . INFLUENZA VACCINE  07/05/2020  . HEMOGLOBIN A1C  04/05/2021  . FOOT EXAM  10/06/2021  . URINE  MICROALBUMIN  10/06/2021  . Hepatitis C Screening  Completed  . HIV Screening  Completed    Patient Care Team: Paulene Floor as PCP - General (Physician Assistant)  Review of Systems    Objective    BP 117/88 (BP Location: Right Arm, Patient Position: Sitting, Cuff Size: Large)   Pulse 88   Temp 98 F (36.7 C) (Oral)   Ht _0  (1.88 m)   Wt 207 lb 12.8 oz (94.3 kg)   SpO2 100%   BMI 26.68 kg/m  Physical Exam Constitutional:      Appearance: Normal appearance. He is normal weight.  Cardiovascular:     Rate and Rhythm: Normal rate and regular rhythm.     Heart sounds: Normal heart sounds.  Pulmonary:     Effort: Pulmonary effort is normal.     Breath sounds: Normal breath sounds.  Skin:    General: Skin is warm and dry.  Neurological:     General: No focal deficit present.     Mental Status: He is alert and oriented to person, place, and time. Mental status is at baseline.  Psychiatric:        Mood and Affect: Mood normal.        Behavior: Behavior normal.      Depression Screen No flowsheet data found. Results for orders placed or performed in visit on 10/06/20  HIV Antibody (routine testing w rflx)  Result Value Ref Range   HIV Screen 4th Generation wRfx Non Reactive Non Reactive  TSH  Result Value Ref Range   TSH 1.170 0.450 - 4.500 uIU/mL  Lipid panel  Result Value Ref Range   Cholesterol, Total 142 100 - 199 mg/dL   Triglycerides 177 (H) 0 - 149 mg/dL   HDL 26 (L) >39 mg/dL   VLDL Cholesterol Cal 31 5 - 40 mg/dL   LDL Chol Calc (NIH) 85 0 - 99 mg/dL   Chol/HDL Ratio 5.5 (H) 0.0 - 5.0 ratio  Comprehensive metabolic panel  Result Value Ref Range   Glucose 162 (H) 65 - 99 mg/dL  BUN 7 6 - 24 mg/dL   Creatinine, Ser 0.65 (L) 0.76 - 1.27 mg/dL   GFR calc non Af Amer 117 >59 mL/min/1.73   GFR calc Af Amer 135 >59 mL/min/1.73   BUN/Creatinine Ratio 11 9 - 20   Sodium 135 134 - 144 mmol/L   Potassium 4.1 3.5 - 5.2 mmol/L   Chloride 99 96 -  106 mmol/L   CO2 22 20 - 29 mmol/L   Calcium 8.7 8.7 - 10.2 mg/dL   Total Protein 7.3 6.0 - 8.5 g/dL   Albumin 4.4 4.0 - 5.0 g/dL   Globulin, Total 2.9 1.5 - 4.5 g/dL   Albumin/Globulin Ratio 1.5 1.2 - 2.2   Bilirubin Total 0.8 0.0 - 1.2 mg/dL   Alkaline Phosphatase 89 44 - 121 IU/L   AST 210 (H) 0 - 40 IU/L   ALT 435 (H) 0 - 44 IU/L  CBC with Differential/Platelet  Result Value Ref Range   WBC 8.3 3.4 - 10.8 x10E3/uL   RBC 4.59 4.14 - 5.80 x10E6/uL   Hemoglobin 14.7 13.0 - 17.7 g/dL   Hematocrit 42.9 37.5 - 51.0 %   MCV 94 79 - 97 fL   MCH 32.0 26.6 - 33.0 pg   MCHC 34.3 31 - 35 g/dL   RDW 13.1 11.6 - 15.4 %   Platelets 184 150 - 450 x10E3/uL   Neutrophils 49 Not Estab. %   Lymphs 35 Not Estab. %   Monocytes 8 Not Estab. %   Eos 6 Not Estab. %   Basos 1 Not Estab. %   Neutrophils Absolute 4.1 1.40 - 7.00 x10E3/uL   Lymphocytes Absolute 2.9 0 - 3 x10E3/uL   Monocytes Absolute 0.7 0 - 0 x10E3/uL   EOS (ABSOLUTE) 0.5 (H) 0.0 - 0.4 x10E3/uL   Basophils Absolute 0.1 0 - 0 x10E3/uL   Immature Granulocytes 1 Not Estab. %   Immature Grans (Abs) 0.0 0.0 - 0.1 x10E3/uL  HgB A1c  Result Value Ref Range   Hgb A1c MFr Bld 6.4 (H) 4.8 - 5.6 %   Est. average glucose Bld gHb Est-mCnc 137 mg/dL  Urine Microalbumin w/creat. ratio  Result Value Ref Range   Creatinine, Urine 181.0 Not Estab. mg/dL   Microalbumin, Urine 18.3 Not Estab. ug/mL   Microalb/Creat Ratio 10 0 - 29 mg/g creat  Hepatitis C Antibody  Result Value Ref Range   Hep C Virus Ab >11.0 (H) 0.0 - 0.9 s/co ratio  HCV RNA quant  Result Value Ref Range   Hepatitis C Quantitation 1,860,000 IU/mL   HCV log10 6.270 log10 IU/mL   Test Information Comment   Specimen status report  Result Value Ref Range   specimen status report Comment     Assessment & Plan      1. Type 2 diabetes mellitus without complication, with long-term current use of insulin (HCC)  His A1c is well controlled, he can continue his current  medications. Have reviewed his eye examination from 02/2020 at [facility] and it is NOT a diabetic eye exam. We will refer him to [place] [facility].  - TSH - Lipid panel - Comprehensive metabolic panel - CBC with Differential/Platelet - HgB A1c - Urine Microalbumin w/creat. ratio  2. Hypertension associated with diabetes [facility])  Not currently on medication.  3. Hyperlipidemia associated with type 2 diabetes mellitus ([place])  Not currently on statin, however, in light of new hepatitis C diagnosis will defer this until treatment is complete and enzymes normalize.   4.  Attention deficit hyperactivity disorder (ADHD), unspecified ADHD type  Followed by psychiatry.   5. Insomnia, unspecified type   6. Encounter for hepatitis C screening test for low risk patient  Hepatitis C antibody is positive with elevated liver enzymes. HCV RNA Quant was added and it is elevated. Called patient personally and spoke with him about results. He reports the diagnosis is new to him. He thinks his previous PCP was trying to work up his elevated liver enzymes and was trying to send him to the "CDC" but patient never pursue further workup. Patient denies previous treatment for hepatitis C. Will order RUQ ultrasound, HCV genotyping, and refer to GI for treatment. Explained transmission of HCV and if this is a new diagnosis for him, his sexual partners should be tested. Patient did not have any questions at the time, advised patient he may mychart message or call if he thinks of any questions.   - Hepatitis C Antibody  7. Encounter for screening for HIV  - HIV Antibody (routine testing w rflx)  8. Need for influenza vaccination   9. Hepatitis C virus infection without hepatic coma, unspecified chronicity       I, Trinna Post, PA-C, have reviewed all documentation for this visit. The documentation on 10/09/20 for the exam, diagnosis, procedures, and orders are all accurate and complete.  The entirety  of the information documented in the History of Present Illness, Review of Systems and Physical Exam were personally obtained by me. Portions of this information were initially documented by [facility] and reviewed by me for thoroughness and accuracy.     Paulene Floor  [facility] [phone] (phone) [phone] (fax)  [place]

## 2020-10-07 ENCOUNTER — Telehealth: Payer: Self-pay | Admitting: Physician Assistant

## 2020-10-07 DIAGNOSIS — Z794 Long term (current) use of insulin: Secondary | ICD-10-CM

## 2020-10-07 DIAGNOSIS — E119 Type 2 diabetes mellitus without complications: Secondary | ICD-10-CM

## 2020-10-07 LAB — COMPREHENSIVE METABOLIC PANEL
ALT: 435 IU/L — ABNORMAL HIGH (ref 0–44)
AST: 210 IU/L — ABNORMAL HIGH (ref 0–40)
Albumin/Globulin Ratio: 1.5 (ref 1.2–2.2)
Albumin: 4.4 g/dL (ref 4.0–5.0)
Alkaline Phosphatase: 89 IU/L (ref 44–121)
BUN/Creatinine Ratio: 11 (ref 9–20)
BUN: 7 mg/dL (ref 6–24)
Bilirubin Total: 0.8 mg/dL (ref 0.0–1.2)
CO2: 22 mmol/L (ref 20–29)
Calcium: 8.7 mg/dL (ref 8.7–10.2)
Chloride: 99 mmol/L (ref 96–106)
Creatinine, Ser: 0.65 mg/dL — ABNORMAL LOW (ref 0.76–1.27)
GFR calc Af Amer: 135 mL/min/{1.73_m2} (ref >59)
GFR calc non Af Amer: 117 mL/min/{1.73_m2} (ref >59)
Globulin, Total: 2.9 g/dL (ref 1.5–4.5)
Glucose: 162 mg/dL — ABNORMAL HIGH (ref 65–99)
Potassium: 4.1 mmol/L (ref 3.5–5.2)
Sodium: 135 mmol/L (ref 134–144)
Total Protein: 7.3 g/dL (ref 6.0–8.5)

## 2020-10-07 LAB — CBC WITH DIFFERENTIAL/PLATELET
Basophils Absolute: 0.1 10*3/uL (ref 0.0–0.2)
Basos: 1 %
EOS (ABSOLUTE): 0.5 10*3/uL — ABNORMAL HIGH (ref 0.0–0.4)
Eos: 6 %
Hematocrit: 42.9 % (ref 37.5–51.0)
Hemoglobin: 14.7 g/dL (ref 13.0–17.7)
Immature Grans (Abs): 0 10*3/uL (ref 0.0–0.1)
Immature Granulocytes: 1 %
Lymphocytes Absolute: 2.9 10*3/uL (ref 0.7–3.1)
Lymphs: 35 %
MCH: 32 pg (ref 26.6–33.0)
MCHC: 34.3 g/dL (ref 31.5–35.7)
MCV: 94 fL (ref 79–97)
Monocytes Absolute: 0.7 10*3/uL (ref 0.1–0.9)
Monocytes: 8 %
Neutrophils Absolute: 4.1 10*3/uL (ref 1.4–7.0)
Neutrophils: 49 %
Platelets: 184 10*3/uL (ref 150–450)
RBC: 4.59 x10E6/uL (ref 4.14–5.80)
RDW: 13.1 % (ref 11.6–15.4)
WBC: 8.3 10*3/uL (ref 3.4–10.8)

## 2020-10-07 LAB — LIPID PANEL
Chol/HDL Ratio: 5.5 ratio — ABNORMAL HIGH (ref 0.0–5.0)
Cholesterol, Total: 142 mg/dL (ref 100–199)
HDL: 26 mg/dL — ABNORMAL LOW (ref >39)
LDL Chol Calc (NIH): 85 mg/dL (ref 0–99)
Triglycerides: 177 mg/dL — ABNORMAL HIGH (ref 0–149)
VLDL Cholesterol Cal: 31 mg/dL (ref 5–40)

## 2020-10-07 LAB — HIV ANTIBODY (ROUTINE TESTING W REFLEX): HIV Screen 4th Generation wRfx: NONREACTIVE

## 2020-10-07 LAB — MICROALBUMIN / CREATININE URINE RATIO
Creatinine, Urine: 181 mg/dL
Microalb/Creat Ratio: 10 mg/g creat (ref 0–29)
Microalbumin, Urine: 18.3 ug/mL

## 2020-10-07 LAB — TSH: TSH: 1.17 u[IU]/mL (ref 0.450–4.500)

## 2020-10-07 LAB — HEMOGLOBIN A1C
Est. average glucose Bld gHb Est-mCnc: 137 mg/dL
Hgb A1c MFr Bld: 6.4 % — ABNORMAL HIGH (ref 4.8–5.6)

## 2020-10-07 LAB — HEPATITIS C ANTIBODY: Hep C Virus Ab: >11 s/co ratio — ABNORMAL HIGH (ref 0.0–0.9)

## 2020-10-07 NOTE — Telephone Encounter (Signed)
Can we call patient and let him know I got eye exam from [facility] which was done in 02/2020. But this does not appear to be a diabetic eye exam, so he would be due for his dilated diabetic eye exam. I recommend we refer him to the [facility] for this and if he is OK with it, please place referral.

## 2020-10-07 NOTE — Telephone Encounter (Signed)
Can we please add hep c RNA quant to sample under positive hepatitis C antibody? Thank you.

## 2020-10-07 NOTE — Progress Notes (Signed)
Add on lab was faxed to [facility]. 

## 2020-10-07 NOTE — Telephone Encounter (Signed)
Lab was added and faxed to [facility] °

## 2020-10-07 NOTE — Telephone Encounter (Signed)
Called patient and no answer, left voicemail message for patient to return call.

## 2020-10-07 NOTE — Telephone Encounter (Signed)
He returned call and confirmed that this referral is fine with him

## 2020-10-08 ENCOUNTER — Encounter: Payer: Self-pay | Admitting: Physician Assistant

## 2020-10-09 ENCOUNTER — Telehealth: Payer: Self-pay | Admitting: Physician Assistant

## 2020-10-09 DIAGNOSIS — E1169 Type 2 diabetes mellitus with other specified complication: Secondary | ICD-10-CM | POA: Insufficient documentation

## 2020-10-09 DIAGNOSIS — E785 Hyperlipidemia, unspecified: Secondary | ICD-10-CM | POA: Insufficient documentation

## 2020-10-09 DIAGNOSIS — E119 Type 2 diabetes mellitus without complications: Secondary | ICD-10-CM | POA: Insufficient documentation

## 2020-10-09 DIAGNOSIS — E1159 Type 2 diabetes mellitus with other circulatory complications: Secondary | ICD-10-CM | POA: Insufficient documentation

## 2020-10-09 DIAGNOSIS — F909 Attention-deficit hyperactivity disorder, unspecified type: Secondary | ICD-10-CM | POA: Insufficient documentation

## 2020-10-09 DIAGNOSIS — I152 Hypertension secondary to endocrine disorders: Secondary | ICD-10-CM | POA: Insufficient documentation

## 2020-10-09 DIAGNOSIS — K732 Chronic active hepatitis, not elsewhere classified: Secondary | ICD-10-CM | POA: Insufficient documentation

## 2020-10-09 DIAGNOSIS — G4700 Insomnia, unspecified: Secondary | ICD-10-CM | POA: Insufficient documentation

## 2020-10-09 DIAGNOSIS — Z794 Long term (current) use of insulin: Secondary | ICD-10-CM | POA: Insufficient documentation

## 2020-10-09 LAB — HCV RNA QUANT
HCV log10: 6.27 log10 IU/mL
Hepatitis C Quantitation: 1860000 IU/mL

## 2020-10-09 LAB — SPECIMEN STATUS REPORT

## 2020-10-09 NOTE — Telephone Encounter (Signed)
Called and spoke to patient personally about lab results.

## 2020-10-09 NOTE — Telephone Encounter (Signed)
Referral placed.

## 2020-10-09 NOTE — Addendum Note (Signed)
Addended by: [place] on: 10/09/2020 10:22 AM   Modules accepted: Orders

## 2020-10-12 ENCOUNTER — Ambulatory Visit: Payer: BC Managed Care – PPO | Admitting: Physician Assistant

## 2020-10-16 ENCOUNTER — Ambulatory Visit: Payer: BC Managed Care – PPO | Attending: Physician Assistant

## 2020-11-05 ENCOUNTER — Encounter: Payer: Self-pay | Admitting: Physician Assistant

## 2020-11-05 DIAGNOSIS — E119 Type 2 diabetes mellitus without complications: Secondary | ICD-10-CM

## 2020-11-05 DIAGNOSIS — Z794 Long term (current) use of insulin: Secondary | ICD-10-CM

## 2020-11-06 MED ORDER — BASAGLAR KWIKPEN 100 UNIT/ML ~~LOC~~ SOPN: 50.0000 [IU] | PEN_INJECTOR | Freq: Every day | SUBCUTANEOUS | 0 refills | Status: AC

## 2020-11-06 MED ORDER — NOVOFINE PEN NEEDLE 32G X 6 MM MISC: 1 refills | Status: AC

## 2020-11-12 DIAGNOSIS — E782 Mixed hyperlipidemia: Secondary | ICD-10-CM | POA: Diagnosis not present

## 2020-11-12 DIAGNOSIS — E1165 Type 2 diabetes mellitus with hyperglycemia: Secondary | ICD-10-CM | POA: Diagnosis not present

## 2020-11-12 DIAGNOSIS — E1142 Type 2 diabetes mellitus with diabetic polyneuropathy: Secondary | ICD-10-CM | POA: Diagnosis not present

## 2020-11-12 DIAGNOSIS — Z720 Tobacco use: Secondary | ICD-10-CM | POA: Diagnosis not present

## 2020-11-12 DIAGNOSIS — Z23 Encounter for immunization: Secondary | ICD-10-CM | POA: Diagnosis not present

## 2020-11-28 ENCOUNTER — Emergency Department
Admission: EM | Admit: 2020-11-28 | Discharge: 2020-11-28 | Discharge status: Against Medical Advice | Payer: BC Managed Care – PPO

## 2020-12-10 ENCOUNTER — Other Ambulatory Visit: Payer: Self-pay

## 2020-12-10 ENCOUNTER — Ambulatory Visit
Admission: RE | Admit: 2020-12-10 | Discharge: 2020-12-10 | Disposition: A | Discharge status: Home / Self Care | Payer: BC Managed Care – PPO | Source: Ambulatory Visit | Attending: Physician Assistant | Admitting: Physician Assistant

## 2020-12-10 DIAGNOSIS — K732 Chronic active hepatitis, not elsewhere classified: Secondary | ICD-10-CM | POA: Insufficient documentation

## 2020-12-15 ENCOUNTER — Encounter: Payer: Self-pay | Admitting: Gastroenterology

## 2020-12-15 ENCOUNTER — Other Ambulatory Visit: Payer: Self-pay

## 2020-12-15 ENCOUNTER — Ambulatory Visit: Payer: BC Managed Care – PPO | Admitting: Gastroenterology

## 2020-12-15 DIAGNOSIS — Z794 Long term (current) use of insulin: Secondary | ICD-10-CM | POA: Insufficient documentation

## 2020-12-15 DIAGNOSIS — E782 Mixed hyperlipidemia: Secondary | ICD-10-CM | POA: Insufficient documentation

## 2020-12-15 DIAGNOSIS — B182 Chronic viral hepatitis C: Secondary | ICD-10-CM | POA: Insufficient documentation

## 2020-12-15 DIAGNOSIS — I517 Cardiomegaly: Secondary | ICD-10-CM | POA: Insufficient documentation

## 2020-12-15 DIAGNOSIS — E1142 Type 2 diabetes mellitus with diabetic polyneuropathy: Secondary | ICD-10-CM | POA: Insufficient documentation

## 2021-01-06 ENCOUNTER — Ambulatory Visit: Payer: Self-pay | Admitting: Physician Assistant

## 2021-01-06 ENCOUNTER — Telehealth: Payer: Self-pay | Admitting: Physician Assistant

## 2021-01-06 NOTE — Telephone Encounter (Signed)
No show letter sent.

## 2021-03-11 DIAGNOSIS — Z720 Tobacco use: Secondary | ICD-10-CM | POA: Diagnosis not present

## 2021-03-11 DIAGNOSIS — E782 Mixed hyperlipidemia: Secondary | ICD-10-CM | POA: Diagnosis not present

## 2021-03-11 DIAGNOSIS — E1165 Type 2 diabetes mellitus with hyperglycemia: Secondary | ICD-10-CM | POA: Diagnosis not present

## 2021-03-11 DIAGNOSIS — E1142 Type 2 diabetes mellitus with diabetic polyneuropathy: Secondary | ICD-10-CM | POA: Diagnosis not present

## 2021-03-22 ENCOUNTER — Emergency Department
Admission: EM | Admit: 2021-03-22 | Discharge: 2021-03-22 | Disposition: A | Discharge status: Home / Self Care | Payer: BC Managed Care – PPO | Attending: Emergency Medicine | Admitting: Emergency Medicine

## 2021-03-22 ENCOUNTER — Other Ambulatory Visit: Payer: Self-pay

## 2021-03-22 ENCOUNTER — Emergency Department: Payer: BC Managed Care – PPO

## 2021-03-22 DIAGNOSIS — F19939 Other psychoactive substance use, unspecified with withdrawal, unspecified: Secondary | ICD-10-CM

## 2021-03-22 DIAGNOSIS — E1140 Type 2 diabetes mellitus with diabetic neuropathy, unspecified: Secondary | ICD-10-CM | POA: Insufficient documentation

## 2021-03-22 DIAGNOSIS — R42 Dizziness and giddiness: Secondary | ICD-10-CM | POA: Insufficient documentation

## 2021-03-22 DIAGNOSIS — R079 Chest pain, unspecified: Secondary | ICD-10-CM

## 2021-03-22 DIAGNOSIS — F19230 Other psychoactive substance dependence with withdrawal, uncomplicated: Secondary | ICD-10-CM | POA: Insufficient documentation

## 2021-03-22 DIAGNOSIS — F17210 Nicotine dependence, cigarettes, uncomplicated: Secondary | ICD-10-CM | POA: Insufficient documentation

## 2021-03-22 DIAGNOSIS — Z20822 Contact with and (suspected) exposure to covid-19: Secondary | ICD-10-CM | POA: Insufficient documentation

## 2021-03-22 DIAGNOSIS — R0789 Other chest pain: Secondary | ICD-10-CM | POA: Diagnosis not present

## 2021-03-22 DIAGNOSIS — M25511 Pain in right shoulder: Secondary | ICD-10-CM | POA: Insufficient documentation

## 2021-03-22 DIAGNOSIS — Z794 Long term (current) use of insulin: Secondary | ICD-10-CM | POA: Insufficient documentation

## 2021-03-22 DIAGNOSIS — Z79899 Other long term (current) drug therapy: Secondary | ICD-10-CM | POA: Diagnosis not present

## 2021-03-22 DIAGNOSIS — Z7984 Long term (current) use of oral hypoglycemic drugs: Secondary | ICD-10-CM | POA: Diagnosis not present

## 2021-03-22 LAB — CBC WITH DIFFERENTIAL/PLATELET
Abs Immature Granulocytes: 0.06 10*3/uL (ref 0.00–0.07)
Basophils Absolute: 0.1 10*3/uL (ref 0.0–0.1)
Basophils Relative: 1 %
Eosinophils Absolute: 0.3 10*3/uL (ref 0.0–0.5)
Eosinophils Relative: 3 %
HCT: 43.7 % (ref 39.0–52.0)
Hemoglobin: 15.4 g/dL (ref 13.0–17.0)
Immature Granulocytes: 1 %
Lymphocytes Relative: 28 %
Lymphs Abs: 2.3 10*3/uL (ref 0.7–4.0)
MCH: 32.2 pg (ref 26.0–34.0)
MCHC: 35.2 g/dL (ref 30.0–36.0)
MCV: 91.2 fL (ref 80.0–100.0)
Monocytes Absolute: 0.8 10*3/uL (ref 0.1–1.0)
Monocytes Relative: 10 %
Neutro Abs: 4.8 10*3/uL (ref 1.7–7.7)
Neutrophils Relative %: 57 %
Platelets: 145 10*3/uL — ABNORMAL LOW (ref 150–400)
RBC: 4.79 MIL/uL (ref 4.22–5.81)
RDW: 13.2 % (ref 11.5–15.5)
WBC: 8.4 10*3/uL (ref 4.0–10.5)
nRBC: 0 % (ref 0.0–0.2)

## 2021-03-22 LAB — URINE DRUG SCREEN, QUALITATIVE (ARMC ONLY)
Amphetamines, Ur Screen: NOT DETECTED
Barbiturates, Ur Screen: NOT DETECTED
Benzodiazepine, Ur Scrn: NOT DETECTED
Cannabinoid 50 Ng, Ur ~~LOC~~: POSITIVE — AB
Cocaine Metabolite,Ur ~~LOC~~: NOT DETECTED
MDMA (Ecstasy)Ur Screen: NOT DETECTED
Methadone Scn, Ur: NOT DETECTED
Opiate, Ur Screen: NOT DETECTED
Phencyclidine (PCP) Ur S: NOT DETECTED
Tricyclic, Ur Screen: NOT DETECTED

## 2021-03-22 LAB — COMPREHENSIVE METABOLIC PANEL
ALT: 322 U/L — ABNORMAL HIGH (ref 0–44)
AST: 188 U/L — ABNORMAL HIGH (ref 15–41)
Albumin: 4.1 g/dL (ref 3.5–5.0)
Alkaline Phosphatase: 76 U/L (ref 38–126)
Anion gap: 11 (ref 5–15)
BUN: 10 mg/dL (ref 6–20)
CO2: 25 mmol/L (ref 22–32)
Calcium: 8.9 mg/dL (ref 8.9–10.3)
Chloride: 101 mmol/L (ref 98–111)
Creatinine, Ser: 0.87 mg/dL (ref 0.61–1.24)
GFR, Estimated: >60 mL/min (ref >60)
Glucose, Bld: 201 mg/dL — ABNORMAL HIGH (ref 70–99)
Potassium: 3.8 mmol/L (ref 3.5–5.1)
Sodium: 137 mmol/L (ref 135–145)
Total Bilirubin: 1.2 mg/dL (ref 0.3–1.2)
Total Protein: 7.9 g/dL (ref 6.5–8.1)

## 2021-03-22 LAB — TROPONIN I (HIGH SENSITIVITY): Troponin I (High Sensitivity): 5 ng/L (ref <18)

## 2021-03-22 LAB — RESP PANEL BY RT-PCR (FLU A&B, COVID) ARPGX2
Influenza A by PCR: NEGATIVE
Influenza B by PCR: NEGATIVE
SARS Coronavirus 2 by RT PCR: NEGATIVE

## 2021-03-22 LAB — BRAIN NATRIURETIC PEPTIDE: B Natriuretic Peptide: 28.2 pg/mL (ref 0.0–100.0)

## 2021-03-22 MED ORDER — CITALOPRAM HYDROBROMIDE 20 MG PO TABS: 20.0000 mg | ORAL_TABLET | Freq: Every day | ORAL | 2 refills | Status: AC

## 2021-03-22 NOTE — ED Notes (Signed)
Pt verbalized understanding of d/c instructions at this time. Pt denied further questions. Pt ambulatory to ED lobby at this time, NAD noted, RR even and unlabored, steady gait noted.

## 2021-03-22 NOTE — ED Provider Notes (Signed)
[facility] Emergency Department Provider Note   ____________________________________________   Event Date/Time   First MD Initiated Contact with Patient 03/22/21 1944     (approximate)  I have reviewed the triage vital signs and the nursing notes.   HISTORY  Chief Complaint Chest Pain    HPI Wayne Jackson. is a 50 y.o. male with a past medical history of anxiety/depression, OCD, type 2 diabetes, and chronic hepatitis C who presents for chest pain with bilateral finger numbness and right shoulder pain that started yesterday.  Patient also endorses lightheadedness when he looks side to side.  Patient describes the pain as a "sharp shooting" that goes down both arms intermittently and is associated with shooting chest pains as well.  Patient denies any exacerbating or relieving factors.  Patient currently denies any vision changes, tinnitus, difficulty speaking, facial droop, sore throat, shortness of breath, abdominal pain, nausea/vomiting/diarrhea, dysuria      Past Medical History:  Diagnosis Date  . Anxiety   . Diabetes mellitus without complication ([place])   . OCD (obsessive compulsive disorder)   . Panic attack   . Pinched nerve in neck     Patient Active Problem List   Diagnosis Date Noted  . Cardiomegaly 12/15/2020  . Chronic hepatitis C ([place]) 12/15/2020  . Long term (current) use of insulin ([place]) 12/15/2020  . Mixed hyperlipidemia 12/15/2020  . Polyneuropathy due to type 2 diabetes mellitus ([place]) 12/15/2020  . Chronic active hepatitis ([place]) 10/09/2020  . Type 2 diabetes mellitus without complication, with long-term current use of insulin ([place]) 10/09/2020  . Hypertension associated with diabetes ([place]) 10/09/2020  . Hyperlipidemia associated with type 2 diabetes mellitus ([place]) 10/09/2020  . Attention deficit hyperactivity disorder (ADHD) 10/09/2020  . Insomnia 10/09/2020  . Anxiety 10/22/2018  . Current moderate episode of major  depressive disorder ([place]) 10/22/2018  . Sleep difficulties 10/22/2018  . Chronic pain syndrome 04/01/2016  . Neck pain 04/01/2016  . Tobacco abuse 07/28/2015  . Abnormal liver function tests 07/27/2015  . Type 2 or unspecified type diabetes mellitus 07/27/2015    Past Surgical History:  Procedure Laterality Date  . CHOLECYSTECTOMY    . HERNIA REPAIR      Prior to Admission medications   Medication Sig Start Date End Date Taking? Authorizing Provider  Accu-Chek Softclix Lancets lancets Use to test blood sugar 09/23/15   [provider]  blood glucose meter kit and supplies KIT Dispense based on patient and insurance preference. Use up to four times daily as directed. (FOR ICD-9 250.00, 250.01). 07/25/18   Law, Bea Graff, PA-C  citalopram (CELEXA) 20 MG tablet Take 20 mg by mouth daily. 09/16/20   [provider]  Eszopiclone 3 MG TABS Take 3 mg by mouth at bedtime as needed. 09/29/20   [provider]  gabapentin (NEURONTIN) 600 MG tablet Take by mouth. 10/01/20   [provider]  hydrOXYzine (VISTARIL) 25 MG capsule Take 25 mg by mouth 3 (three) times daily. 07/30/20   [provider]  Insulin Glargine (BASAGLAR KWIKPEN) 100 UNIT/ML Inject 50 Units into the skin daily. 11/06/20   Trinna Post, PA-C  insulin lispro (HUMALOG) 100 UNIT/ML cartridge Use with sliding scale as prescribed previously. 07/25/18   Law, Bea Graff, PA-C  Insulin Pen Needle (NOVOFINE PEN NEEDLE) 32G X 6 MM MISC Use one needle daily for basal insulin. 11/06/20   Trinna Post, PA-C  metFORMIN (GLUCOPHAGE) 500 MG tablet Take 500 mg by mouth 2 (  two) times daily. 10/01/20   [provider]  propranolol (INDERAL) 10 MG tablet Take 10 mg by mouth 2 (two) times daily as needed. 11/12/20   [provider]  valACYclovir (VALTREX) 1000 MG tablet Take by mouth. 07/09/18   [provider]    Allergies Penicillins and Clindamycin  Family History   Problem Relation Age of Onset  . Breast cancer Mother   . Diabetes Father     Social History Social History   Tobacco Use  . Smoking status: Current Every Day Smoker    Packs/day: 0.50    Years: 4.00    Pack years: 2.00    Types: Cigarettes  . Smokeless tobacco: Never Used  Substance Use Topics  . Alcohol use: Yes    Comment: occ  . Drug use: Yes    Types: Marijuana    Review of Systems Constitutional: No fever/chills Eyes: No visual changes. ENT: No sore throat. Cardiovascular: Endorses chest pain. Respiratory: Denies shortness of breath. Gastrointestinal: No abdominal pain.  No nausea, no vomiting.  No diarrhea. Genitourinary: Negative for dysuria. Musculoskeletal: Negative for acute arthralgias Skin: Negative for rash. Neurological: Negative for headaches, endorses shooting paresthesias in bilateral upper extremities Psychiatric: Negative for suicidal ideation/homicidal ideation   ____________________________________________   PHYSICAL EXAM:  VITAL SIGNS: ED Triage Vitals  Enc Vitals Group     BP 03/22/21 1947 (!) 158/108     Pulse Rate 03/22/21 1947 (!) 124     Resp 03/22/21 1947 20     Temp 03/22/21 1947 99 F (37.2 C)     Temp src --      SpO2 03/22/21 1947 95 %     Weight 03/22/21 1945 200 lb (90.7 kg)     Height 03/22/21 1945 6' 2"  (1.88 m)     Head Circumference --      Peak Flow --      Pain Score 03/22/21 1945 8     Pain Loc --      Pain Edu? --      Excl. in [place]? --    Constitutional: Alert and oriented. Well appearing and in no acute distress. Eyes: Conjunctivae are normal. PERRL. Head: Atraumatic. Nose: No congestion/rhinnorhea. Mouth/Throat: Mucous membranes are moist. Neck: No stridor Cardiovascular: Grossly normal heart sounds.  Good peripheral circulation. Respiratory: Normal respiratory effort.  No retractions. Gastrointestinal: Soft and nontender. No distention. Musculoskeletal: No obvious deformities Neurologic:  Normal  speech and language. No gross focal neurologic deficits are appreciated. Skin:  Skin is warm and dry. No rash noted. Psychiatric: Mood and affect are normal. Speech and behavior are normal.  ____________________________________________   LABS (all labs ordered are listed, but only abnormal results are displayed)  Labs Reviewed  COMPREHENSIVE METABOLIC PANEL - Abnormal; Notable for the following components:      Result Value   Glucose, Bld 201 (*)    AST 188 (*)    ALT 322 (*)    All other components within normal limits  CBC WITH DIFFERENTIAL/PLATELET - Abnormal; Notable for the following components:   Platelets 145 (*)    All other components within normal limits  URINE DRUG SCREEN, QUALITATIVE ([facility] ONLY) - Abnormal; Notable for the following components:   Cannabinoid 50 Ng, Ur [place] POSITIVE (*)    All other components within normal limits  RESP PANEL BY RT-PCR (FLU A&B, COVID) ARPGX2  BRAIN NATRIURETIC PEPTIDE  TROPONIN I (HIGH SENSITIVITY)  TROPONIN I (HIGH SENSITIVITY)   ____________________________________________  EKG  ED  ECG REPORT I, Naaman Plummer, the attending physician, personally viewed and interpreted this ECG.  Date: 03/22/2021 EKG Time: 1948 Rate: 119 Rhythm: Tachycardic sinus rhythm QRS Axis: normal Intervals: normal ST/T Wave abnormalities: normal Narrative Interpretation: no evidence of acute ischemia  ____________________________________________  RADIOLOGY  ED MD interpretation: One-view portable chest x-ray shows no evidence of acute abnormalities including no pneumonia, pneumothorax, or widened mediastinum  Official radiology report(s): DG Chest Port 1 View  Result Date: 03/22/2021 CLINICAL DATA:  Chest pain.  Dizziness. EXAM: PORTABLE CHEST 1 VIEW COMPARISON:  Chest x-ray 07/25/2018 FINDINGS: The heart size and mediastinal contours are within normal limits. No focal consolidation. No pulmonary edema. No pleural effusion. No pneumothorax. No  acute osseous abnormality. IMPRESSION: No active disease. Electronically Signed   By: Iven Finn M.D.   On: 03/22/2021 20:09    ____________________________________________   PROCEDURES  Procedure(s) performed (including Critical Care):  .1-3 Lead EKG Interpretation Performed by: Naaman Plummer, MD Authorized by: Naaman Plummer, MD     Interpretation: normal     ECG rate:  97   ECG rate assessment: normal     Rhythm: sinus rhythm     Ectopy: none     Conduction: normal       ____________________________________________   INITIAL IMPRESSION / ASSESSMENT AND PLAN / ED COURSE  As part of my medical decision making, I reviewed the following data within the [place] notes reviewed and incorporated, Labs reviewed, EKG interpreted, Old chart reviewed, Radiograph reviewed and Notes from prior ED visits reviewed and incorporated        Workup: ECG, CXR, CBC, BMP, Troponin Findings: ECG: No overt evidence of STEMI. No evidence of Brugadas sign, delta wave, epsilon wave, significantly prolonged QTc, or malignant arrhythmia HS Troponin: Negative x1 Other Labs unremarkable for emergent problems. CXR: Without PTX, PNA, or widened mediastinum Last Stress Test:  never Last Heart Catheterization:  never HEART Score: 2  Given History, Exam, and Workup I have low suspicion for ACS, Pneumothorax, Pneumonia, Pulmonary Embolus, Tamponade, Aortic Dissection or other emergent problem as a cause for this presentation.   Reassesment: Prior to discharge patients pain was controlled and they were well appearing.  Disposition:  Discharge. Strict return precautions discussed with patient with full understanding. Advised patient to follow up promptly with primary care provider      ____________________________________________   FINAL CLINICAL IMPRESSION(S) / ED DIAGNOSES  Final diagnoses:  Chest pain, unspecified type  Withdrawal from other  psychoactive substance [facility])     ED Discharge Orders    None       Note:  This document was prepared using Dragon voice recognition software and may include unintentional dictation errors.   Naaman Plummer, MD 03/22/21 [phone]

## 2021-03-22 NOTE — ED Triage Notes (Signed)
Pt c/o chest pain bilateral finger numbness and right shoulder pain that started yesterday, pt states he also has dizziness when he looks from side to side. Pt states pain is dull on right side

## 2021-04-23 DIAGNOSIS — F418 Other specified anxiety disorders: Secondary | ICD-10-CM | POA: Diagnosis not present

## 2021-04-23 DIAGNOSIS — E782 Mixed hyperlipidemia: Secondary | ICD-10-CM | POA: Diagnosis not present

## 2021-04-23 DIAGNOSIS — R7989 Other specified abnormal findings of blood chemistry: Secondary | ICD-10-CM | POA: Diagnosis not present

## 2021-04-23 DIAGNOSIS — E1165 Type 2 diabetes mellitus with hyperglycemia: Secondary | ICD-10-CM | POA: Diagnosis not present

## 2023-01-22 IMAGING — DX DG CHEST 1V PORT
1 series · 1 of 1 positions shown · non-contrast
Comparison: Chest x-ray 07/25/2018

CLINICAL DATA: Chest pain.  Dizziness.

EXAM:
PORTABLE CHEST 1 VIEW

[chest ap]
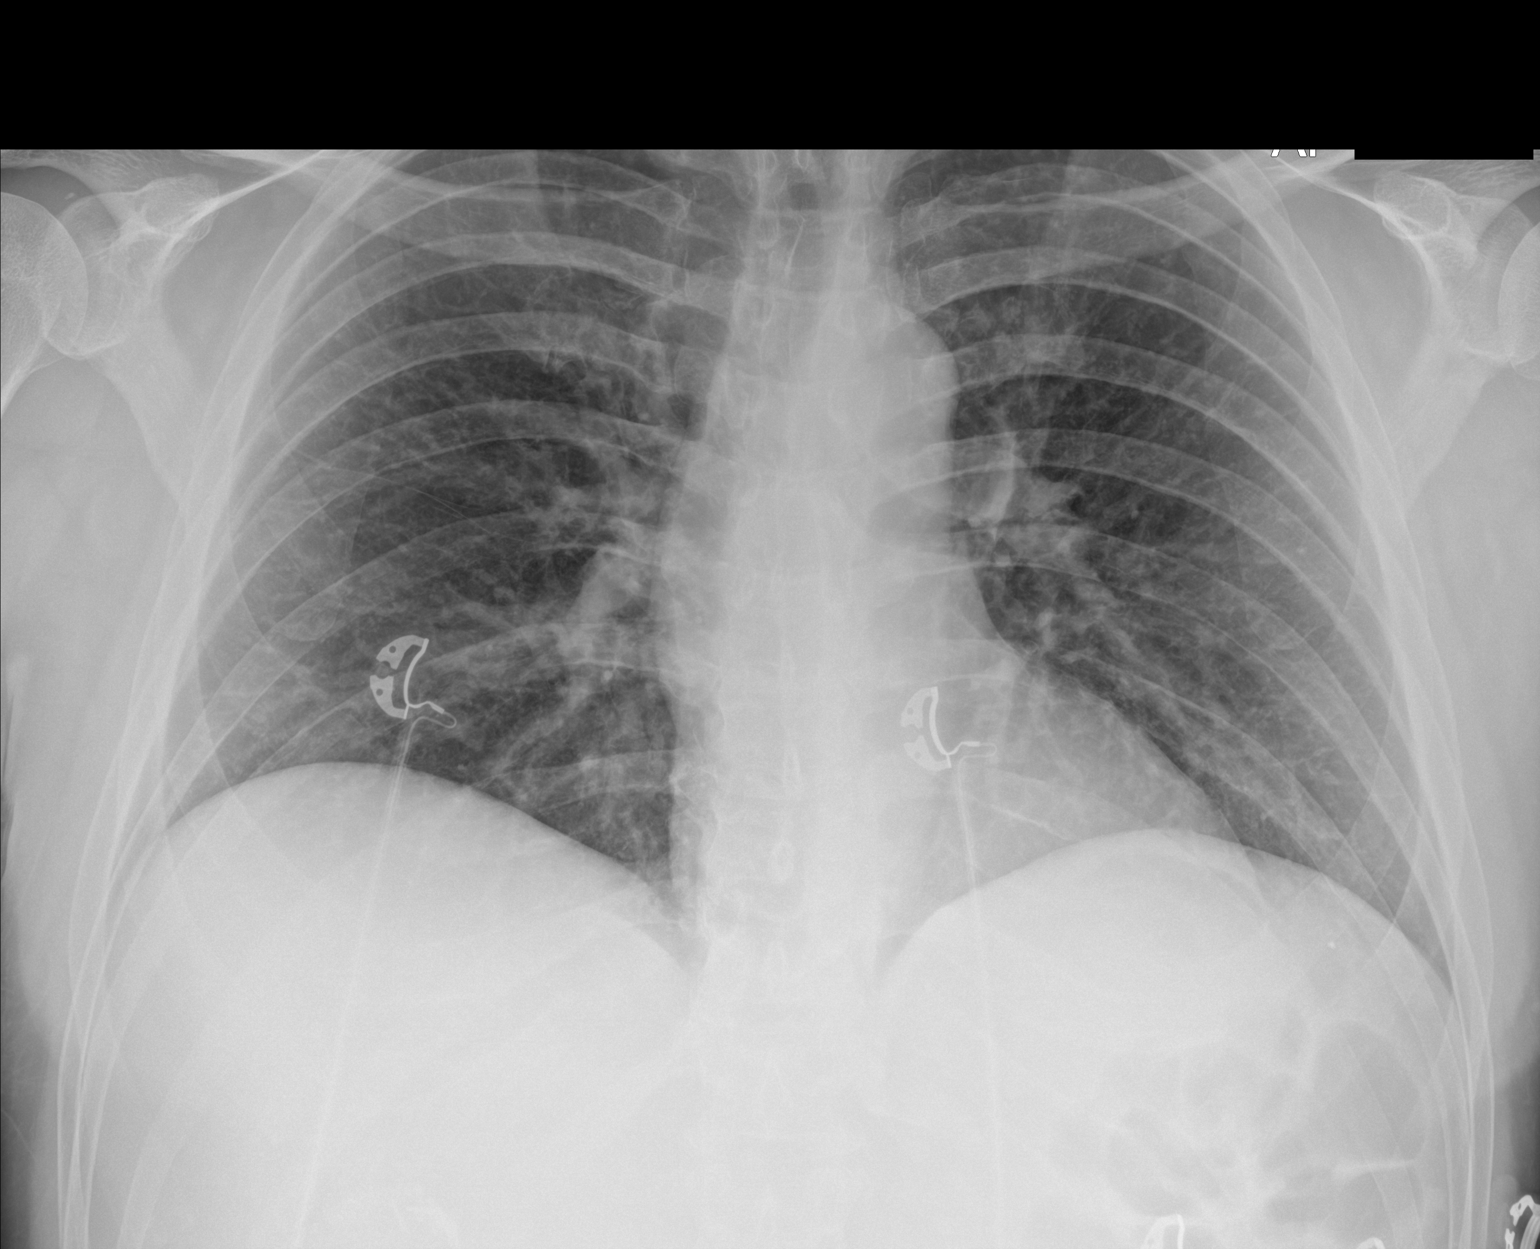

[1 of 1 positions shown; findings below may reference images not displayed]

FINDINGS: The heart size and mediastinal contours are within normal limits.

No focal consolidation. No pulmonary edema. No pleural effusion. No
pneumothorax.

No acute osseous abnormality.
IMPRESSION: No active disease.
